# Patient Record
Sex: Female | Born: 1985
Health system: Southern US, Community
[De-identification: ages and names within clinical notes are randomized; demographics above are authoritative.]

---

## 2017-01-20 DIAGNOSIS — Z6820 Body mass index (BMI) 20.0-20.9, adult: Secondary | ICD-10-CM | POA: Diagnosis not present

## 2017-01-20 DIAGNOSIS — Z01419 Encounter for gynecological examination (general) (routine) without abnormal findings: Secondary | ICD-10-CM | POA: Diagnosis not present

## 2017-02-16 ENCOUNTER — Other Ambulatory Visit: Payer: Self-pay | Admitting: Sports Medicine

## 2017-02-16 DIAGNOSIS — M5412 Radiculopathy, cervical region: Secondary | ICD-10-CM | POA: Diagnosis not present

## 2017-02-16 DIAGNOSIS — M542 Cervicalgia: Secondary | ICD-10-CM

## 2017-02-16 DIAGNOSIS — N870 Mild cervical dysplasia: Secondary | ICD-10-CM | POA: Diagnosis not present

## 2017-02-16 DIAGNOSIS — R87612 Low grade squamous intraepithelial lesion on cytologic smear of cervix (LGSIL): Secondary | ICD-10-CM | POA: Diagnosis not present

## 2017-02-16 MED FILL — ALPRAZolam 0.25 MG TABS: 0.25 | 15 days supply | Qty: 30 | Fill #0

## 2017-02-23 ENCOUNTER — Other Ambulatory Visit: Payer: Self-pay

## 2017-02-28 ENCOUNTER — Ambulatory Visit (HOSPITAL_COMMUNITY)
Admission: RE | Admit: 2017-02-28 | Discharge: 2017-02-28 | Disposition: A | Discharge status: Home / Self Care | Payer: 59 | Source: Ambulatory Visit | Attending: Sports Medicine | Admitting: Sports Medicine

## 2017-02-28 DIAGNOSIS — M50222 Other cervical disc displacement at C5-C6 level: Secondary | ICD-10-CM | POA: Insufficient documentation

## 2017-02-28 DIAGNOSIS — M542 Cervicalgia: Secondary | ICD-10-CM | POA: Insufficient documentation

## 2017-02-28 DIAGNOSIS — M47812 Spondylosis without myelopathy or radiculopathy, cervical region: Secondary | ICD-10-CM | POA: Insufficient documentation

## 2017-03-14 DIAGNOSIS — F411 Generalized anxiety disorder: Secondary | ICD-10-CM | POA: Diagnosis not present

## 2017-05-12 DIAGNOSIS — F411 Generalized anxiety disorder: Secondary | ICD-10-CM | POA: Diagnosis not present

## 2017-07-11 DIAGNOSIS — F411 Generalized anxiety disorder: Secondary | ICD-10-CM | POA: Diagnosis not present

## 2017-07-11 DIAGNOSIS — N926 Irregular menstruation, unspecified: Secondary | ICD-10-CM | POA: Diagnosis not present

## 2017-07-11 DIAGNOSIS — N760 Acute vaginitis: Secondary | ICD-10-CM | POA: Diagnosis not present

## 2017-07-21 ENCOUNTER — Encounter: Payer: Self-pay | Admitting: Hematology

## 2017-07-21 ENCOUNTER — Telehealth: Payer: Self-pay | Admitting: Hematology

## 2017-07-21 NOTE — Telephone Encounter (Signed)
Pt has been scheduled to see Dr. Irene Limbo on 9/6 at 11am. Pt aware to arrive 30 minutes early. Address and insurance verified. Letter mailed.

## 2017-08-08 NOTE — Progress Notes (Signed)
HEMATOLOGY/ONCOLOGY CONSULTATION NOTE  Date of Service: 08/10/2017  Patient Care Team: Patient, No Pcp Per as PCP - General (General Practice)  CHIEF COMPLAINTS/PURPOSE OF CONSULTATION:  Low WBC  HISTORY OF PRESENTING ILLNESS:   Christina Hubbard is a wonderful 31 y.o. female who has been referred to [place] by Dr. Julien Girt her Gynecologist for evaluation and management of low WBCs and an palpable swollen lymph node in bilateral groin area.   Patient had routine labs with Dr. Julien Girt on 07/12/2017 which showed leukopenia with a WBC count of 3.2k with an [place] of 1150. Normal hemoglobin of 12.6 with a normal platelet count of 223k.  Patient notes she was diagnosed with bacterial vaginosis by her gynecologist, which she was treated for. She notes to having chronic back pain around her scapula area that will go up her neck and bring numbness to her fingers. This back pain started 10 years ago and recently has gotten worse. She has had a MRI of her c-spine and an Xray was done as well.  MRI of the C-spine showed Straightening of cervical lordosis with slight reversal at C4-5. No acute osseous abnormality or abnormal cord signal. Small C5-6 central disc protrusion with slight effacement of ventral thecal sac. No significant foraminal narrowing or canal stenosis   She notes a year and a half ago a rash that comes and goes in her groin area that responded to antifungal medication. Each time she sees lymph nodes and they change in appearence and can be seen in the mirror. No rash anywhere else. The rash was flat and reddish brown with edges and some satellite bumps. On clinical examination today the patient had minimal subcentimeter few inguinal nodules which one significantly increased. Minimal rash with some mild postinflammatory hyperpigmentation.  Socially she denies smoking and rarely drinks and she has not been pregnant before. Her mother was just diagnosed with ER/PR positive stage IIB breast cancer at 31  yo and did not meet criteria for genetic testing.    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 She denies fevers, chills, coughs, and colds. Lately she has headaches in her cervical spine area, Cervicalgia. She reports this stems from her job. She will take advil 2-3 times a week for pain along with ibuprofen. Has been using up to 1200 mg of ibuprofen daily. I discussed how this can effect her WBC. She has never been told her blood counts were low before.   She does not see a PCP but does see her gynecologist regularly. She denies chills, fever but has lost 10 pounds in a year and a half. She relates this to healthier living.   Her previous 07/12/17 lab results showed Hg and platelet counts are normal and her WBC and ANC were borderline low as noted above. I discussed the possibility of isolated neutropenia related to her infection or the use of NSAIDs. She denies recent vaccinations and tattoos. She denies taking any supplements.    She has had no previous labs demonstrating leukopenia or neutropenia and no issues with frequent or significant infections.  MEDICAL HISTORY:  1) Chronic neck pains  MRI c spine with Straightening of cervical lordosis with slight reversal at C4-5. No acute osseous abnormality or abnormal cord signal. Small C5-6 central disc protrusion with slight effacement of ventral thecal sac. No significant foraminal narrowing or canal stenosis  2) Candida Intertrigo  3) bacterial  vaginosis   SURGICAL HISTORY:  wisdom teeth extractions    SOCIAL HISTORY: Social History   Social History  . Marital status: Single    Spouse name: N/A  . Number of children: N/A  . Years of education: N/A   Occupational History  . Not on file.   Social History Main Topics  . Smoking status: Not on file  . Smokeless tobacco: Not on file  . Alcohol use Not on file  . Drug use: Unknown  . Sexual activity:  Not on file   Other Topics Concern  . Not on file   Social History Narrative  . 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   FAMILY HISTORY: Her mother was just diagnosed with ER/PR positive stage IIB breast cancer at 31 yo and did not meet criteria for genetic testing.  ALLERGIES:  has no allergies on file.  MEDICATIONS:  No current outpatient prescriptions on file.   No current facility-administered medications for this visit.     REVIEW OF SYSTEMS:    10 Point review of Systems was done is negative except as noted above.  PHYSICAL EXAMINATION: ECOG PERFORMANCE STATUS: 0 - Asymptomatic  . Vitals:   08/10/17 1119  BP: 118/64  Pulse: 99  Resp: 20  Temp: 98.2 F (36.8 C)  SpO2: 100%   Filed Weights   08/10/17 1119  Weight: 138 lb 11.2 oz (62.9 kg)   .Body mass index is 19.34 kg/m.  GENERAL:alert, in no acute distress and comfortable SKIN: no acute rashes, no significant lesions (+) rash minimal resudial hyperpigmentation from healed vacuolar rash in the medial groin bilaterally and some on mons pubis EYES: conjunctiva are pink and non-injected, sclera anicteric OROPHARYNX: MMM, no exudates, no oropharyngeal erythema or ulceration NECK: supple, no JVD LYMPH:  no palpable lymphadenopathy in the cervical, axillary or inguinal regions (+) few minimal sub centimeter inguinal lymph nodes bilaterally, no significant palpable lymphadenopathy LUNGS: clear to auscultation b/l with normal respiratory effort HEART: regular rate & rhythm ABDOMEN:  normoactive bowel sounds , non tender, not distended. Extremity: no pedal edema MSK: (+) Right Trapezius appears strained an tight and is likely the etiology of the patient's focal upper thoracic paraspinal back pain . PSYCH: alert & oriented x 3 with fluent speech NEURO: no focal  motor/sensory deficits  LABORATORY DATA:  I have reviewed the data as listed  . CBC Latest Ref Rng & Units 08/10/2017  WBC 3.9 - 10.3 10e3/uL 5.0  Hemoglobin 11.6 - 15.9 g/dL 13.3  Hematocrit 34.8 - 46.6 % 39.5  Platelets 145 - 400 10e3/uL 227   . CBC    Component Value Date/Time   WBC 5.0 08/10/2017 1220   RBC 4.26 08/10/2017 1220   HGB 13.3 08/10/2017 1220   HCT 39.5 08/10/2017 1220   PLT 227 08/10/2017 1220   MCV 92.7 08/10/2017 1220   MCH 31.2 08/10/2017 1220   MCHC 33.7 08/10/2017 1220   RDW 12.5 08/10/2017 1220   LYMPHSABS 1.5 08/10/2017 1220   MONOABS 0.3 08/10/2017 1220   EOSABS 0.1 08/10/2017 1220   BASOSABS 0.0 08/10/2017 1220   ANC 3100 . CMP Latest Ref Rng & Units 08/10/2017  Glucose 70 - 140 mg/dl 96  BUN 7.0 - 26.0 mg/dL 9.2  Creatinine 0.6 - 1.1 mg/dL 0.8  Sodium 136 - 145 mEq/L  138  Potassium 3.5 - 5.1 mEq/L 4.0  CO2 22 - 29 mEq/L 24  Calcium 8.4 - 10.4 mg/dL 9.8  Total Protein 6.4 - 8.3 g/dL 8.1  Total Bilirubin 0.20 - 1.20 mg/dL 0.86  Alkaline Phos 40 - 150 U/L 45  AST 5 - 34 U/L 18  ALT 0 - 55 U/L 8   . Lab Results  Component Value Date   LDH 129 08/10/2017     RADIOGRAPHIC STUDIES: I have personally reviewed the radiological images as listed and agreed with the findings in the report. No results found.  ASSESSMENT & PLAN:  Meilyn Heindl is a 31 y.o. female with  #1 Low White Blood Count leucopenia/Neutropenia -I discussed her previous 07/12/17 CBC which showed low WBC at 3.2 and ANC was 1152 with normal Hg and RBCs.  Repeat labs today show normalization of her leukopenia with a WBC count of 5k with an [place] of 3100.  Transient isolated neutropenia most likely related to use of NSAIDs or from her Bacterial vaginosis infection with the use of antibiotics. She does not report any recent overt viral infections. No tattoos. No overt body fluid exposure. No previous blood transfusions. No symptomatology suggestive of an autoimmune  condition. No issues with significant infections to suggest functional immunodeficiency. Plan -I discussed the possible etiologies of isolated neutropenia.  -I called her back with her initial lab results showing normalization of WBC counts and neutrophils  -she was quite relieved by this understandably .  given her occupation as a Marine scientist and risk of bodies of exposures we did send out the HIV and hepatitis C test which we shall follow up on  -LDH levels within normal limits with no overt evidence of significant palpable lymphadenopathy or hepatosplenomegaly to suggest a lymphoproliferative process. -B12 levels were still currently pending. -No additional hematologic workup recommended at this time -Patient will try to minimize use of NSAIDs this was the likely etiology of her neutropenia .  #2 Chronic upper right back pain  -today's Physical exam revealed tightness in the right trapezius muscle.  -She uses ibuprofen regularly for her back pain which can effect her WBCs. I suggest she switches to tylenol or topical NSAID or heating pads. She agreed. -I suggest she follow up with an orthopedist or physical therapist.   #3 Recurring rash in bilateral groin area -By physical exam, rash is reduced with remaining slight pigmentation. I recommend to keep area dry and watch what products she uses. Her inguinal lymph nodes are not significantly enlarged.   RTC with Dr. Irene Limbo as needed based on lasb   All of the patients questions were answered with apparent satisfaction. The patient knows to call the clinic with any problems, questions or concerns.  I spent 40 minutes counseling the patient face to face. The total time spent in the appointment was 60 minutes and more than 50% was on counseling and direct patient cares.  This document serves as a record of services personally performed by Sullivan Lone, MD. It was created on her behalf by Joslyn Devon, a trained medical scribe. The creation of this  record is based on the scribe's personal observations and the provider's statements to them. This document has been checked and approved by the attending provider.   Sullivan Lone MD [place] AAHIVMS [facility] Hematology/Oncology Physician [facility]  (Office):       [phone] (Work cell):  [phone] (Fax):           [phone]  08/10/2017  11:50 AM

## 2017-08-10 ENCOUNTER — Telehealth: Payer: Self-pay | Admitting: Hematology

## 2017-08-10 ENCOUNTER — Ambulatory Visit (HOSPITAL_BASED_OUTPATIENT_CLINIC_OR_DEPARTMENT_OTHER): Payer: 59

## 2017-08-10 ENCOUNTER — Ambulatory Visit (HOSPITAL_BASED_OUTPATIENT_CLINIC_OR_DEPARTMENT_OTHER): Payer: 59 | Admitting: Hematology

## 2017-08-10 VITALS — BP 118/64 | HR 99 | Temp 98.2°F | Resp 20 | Ht 71.0 in | Wt 138.7 lb

## 2017-08-10 DIAGNOSIS — D709 Neutropenia, unspecified: Secondary | ICD-10-CM

## 2017-08-10 DIAGNOSIS — G8929 Other chronic pain: Secondary | ICD-10-CM | POA: Diagnosis not present

## 2017-08-10 DIAGNOSIS — R21 Rash and other nonspecific skin eruption: Secondary | ICD-10-CM

## 2017-08-10 DIAGNOSIS — M5489 Other dorsalgia: Secondary | ICD-10-CM | POA: Diagnosis not present

## 2017-08-10 LAB — COMPREHENSIVE METABOLIC PANEL
ALBUMIN: 4.3 g/dL (ref 3.5–5.0)
ALK PHOS: 45 U/L (ref 40–150)
ALT: 8 U/L (ref 0–55)
AST: 18 U/L (ref 5–34)
Anion Gap: 9 mEq/L (ref 3–11)
BUN: 9.2 mg/dL (ref 7.0–26.0)
CO2: 24 mEq/L (ref 22–29)
Calcium: 9.8 mg/dL (ref 8.4–10.4)
Chloride: 105 mEq/L (ref 98–109)
Creatinine: 0.8 mg/dL (ref 0.6–1.1)
EGFR: >90 mL/min/{1.73_m2} (ref >90)
GLUCOSE: 96 mg/dL (ref 70–140)
POTASSIUM: 4 meq/L (ref 3.5–5.1)
SODIUM: 138 meq/L (ref 136–145)
TOTAL PROTEIN: 8.1 g/dL (ref 6.4–8.3)
Total Bilirubin: 0.86 mg/dL (ref 0.20–1.20)

## 2017-08-10 LAB — CBC & DIFF AND RETIC
BASO%: 0.4 % (ref 0.0–2.0)
Basophils Absolute: 0 10*3/uL (ref 0.0–0.1)
EOS ABS: 0.1 10*3/uL (ref 0.0–0.5)
EOS%: 1 % (ref 0.0–7.0)
HCT: 39.5 % (ref 34.8–46.6)
HEMOGLOBIN: 13.3 g/dL (ref 11.6–15.9)
IMMATURE RETIC FRACT: 1.4 % — AB (ref 1.60–10.00)
LYMPH%: 29.6 % (ref 14.0–49.7)
MCH: 31.2 pg (ref 25.1–34.0)
MCHC: 33.7 g/dL (ref 31.5–36.0)
MCV: 92.7 fL (ref 79.5–101.0)
MONO#: 0.3 10*3/uL (ref 0.1–0.9)
MONO%: 6.3 % (ref 0.0–14.0)
NEUT%: 62.7 % (ref 38.4–76.8)
NEUTROS ABS: 3.1 10*3/uL (ref 1.5–6.5)
Platelets: 227 10*3/uL (ref 145–400)
RBC: 4.26 10*6/uL (ref 3.70–5.45)
RDW: 12.5 % (ref 11.2–14.5)
RETIC %: 0.6 % — AB (ref 0.70–2.10)
Retic Ct Abs: 25.56 10*3/uL — ABNORMAL LOW (ref 33.70–90.70)
WBC: 5 10*3/uL (ref 3.9–10.3)
lymph#: 1.5 10*3/uL (ref 0.9–3.3)

## 2017-08-10 LAB — CHCC SMEAR

## 2017-08-10 LAB — LACTATE DEHYDROGENASE: LDH: 129 U/L (ref 125–245)

## 2017-08-10 NOTE — Telephone Encounter (Signed)
Scheduled lab add on per 9.6 los. No additional appts.

## 2017-08-10 NOTE — Patient Instructions (Signed)
Thank you for choosing [facility] to provide your oncology and hematology care.  To afford each patient quality time with our providers, please arrive 30 minutes before your scheduled appointment time.  If you arrive late for your appointment, you may be asked to reschedule.  We strive to give you quality time with our providers, and arriving late affects you and other patients whose appointments are after yours.   If you are a no show for multiple scheduled visits, you may be dismissed from the clinic at the providers discretion.    Again, thank you for choosing [facility], our hope is that these requests will decrease the amount of time that you wait before being seen by our physicians.  ______________________________________________________________________  Should you have questions after your visit to the [facility], please contact our office at [phone] between the hours of 8:30 and 4:30 p.m.    Voicemails left after 4:30p.m will not be returned until the following business day.    For prescription refill requests, please have your pharmacy contact us directly.  Please also try to allow 48 hours for prescription requests.    Please contact the scheduling department for questions regarding scheduling.  For scheduling of procedures such as PET scans, CT scans, MRI, Ultrasound, etc please contact central scheduling at [phone].    Resources For Cancer Patients and Caregivers:   Oncolink.org:  A wonderful resource for patients and healthcare providers for information regarding your disease, ways to tract your treatment, what to expect, etc.     American Cancer Society:  800-227-2345  Can help patients locate various types of support and financial assistance  Cancer Care: 1-800-813-HOPE (4673) Provides financial assistance, online support groups, medication/co-pay assistance.    [place] DSS:  336-641-3447 Where to apply for food  stamps, Medicaid, and utility assistance  Medicare Rights Center: 800-333-4114 Helps people with Medicare understand their rights and benefits, navigate the Medicare system, and secure the quality healthcare they deserve  SCAT: 336-333-6589 [place] [employer]'s shared-ride transportation service for eligible riders who have a disability that prevents them from riding the fixed route bus.    For additional information on assistance programs please contact our social worker:   Grier Hock/Abigail Elmore:  [phone]            

## 2017-08-11 LAB — SEDIMENTATION RATE: SED RATE: 2 mm/h (ref 0–32)

## 2017-08-11 LAB — HIV ANTIBODY (ROUTINE TESTING W REFLEX): HIV Screen 4th Generation wRfx: NONREACTIVE

## 2017-08-11 LAB — HEPATITIS C ANTIBODY: Hep C Virus Ab: <0.1 s/co ratio (ref 0.0–0.9)

## 2017-08-11 LAB — VITAMIN B12: VITAMIN B 12: 317 pg/mL (ref 232–1245)

## 2018-01-25 DIAGNOSIS — D225 Melanocytic nevi of trunk: Secondary | ICD-10-CM | POA: Diagnosis not present

## 2018-01-25 DIAGNOSIS — L811 Chloasma: Secondary | ICD-10-CM | POA: Diagnosis not present

## 2018-01-25 DIAGNOSIS — H5213 Myopia, bilateral: Secondary | ICD-10-CM | POA: Diagnosis not present

## 2018-01-25 DIAGNOSIS — B360 Pityriasis versicolor: Secondary | ICD-10-CM | POA: Diagnosis not present

## 2018-01-25 DIAGNOSIS — L821 Other seborrheic keratosis: Secondary | ICD-10-CM | POA: Diagnosis not present

## 2018-02-06 DIAGNOSIS — Z681 Body mass index (BMI) 19 or less, adult: Secondary | ICD-10-CM | POA: Diagnosis not present

## 2018-02-06 DIAGNOSIS — Z01419 Encounter for gynecological examination (general) (routine) without abnormal findings: Secondary | ICD-10-CM | POA: Diagnosis not present

## 2018-03-13 DIAGNOSIS — F411 Generalized anxiety disorder: Secondary | ICD-10-CM | POA: Diagnosis not present

## 2018-03-21 DIAGNOSIS — F411 Generalized anxiety disorder: Secondary | ICD-10-CM | POA: Diagnosis not present

## 2018-04-16 DIAGNOSIS — N871 Moderate cervical dysplasia: Secondary | ICD-10-CM | POA: Diagnosis not present

## 2018-04-16 DIAGNOSIS — N870 Mild cervical dysplasia: Secondary | ICD-10-CM | POA: Diagnosis not present

## 2018-04-16 DIAGNOSIS — R87810 Cervical high risk human papillomavirus (HPV) DNA test positive: Secondary | ICD-10-CM | POA: Diagnosis not present

## 2018-04-16 DIAGNOSIS — R87610 Atypical squamous cells of undetermined significance on cytologic smear of cervix (ASC-US): Secondary | ICD-10-CM | POA: Diagnosis not present

## 2018-07-03 IMAGING — MR MR CERVICAL SPINE W/O CM
4 of 5 series · 19 of 48 positions shown · IV contrast (Yes)
Comparison: None.

CLINICAL DATA: 30 y/o  F; neck pain radiating to the right arm.

EXAM:
MRI CERVICAL SPINE WITHOUT CONTRAST
TECHNIQUE: Multiplanar, multisequence MR imaging of the cervical spine was
performed. No intravenous contrast was administered.

[Series 2: T1 · sagittal · 3.0mm · 0.41mm/px · 3 of 12 slices shown]
[im 1/12]
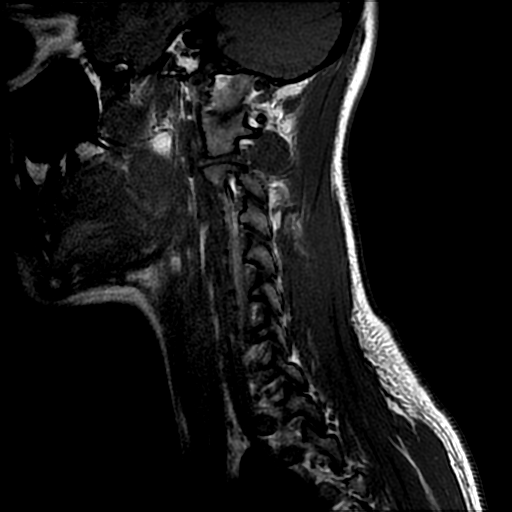
[im 6/12]
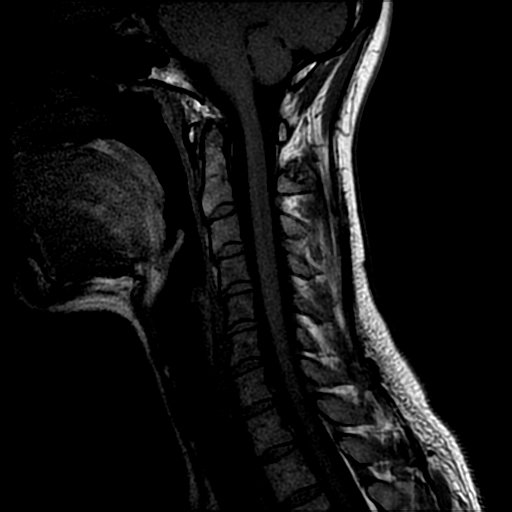
[im 12/12]
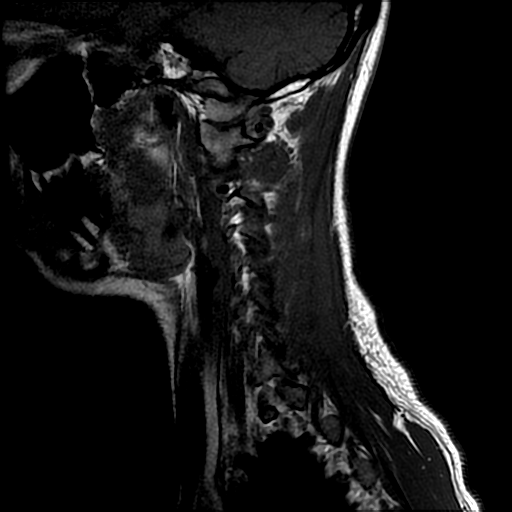

[Series 3: sag ir · sagittal · 3.0mm · 0.41mm/px · 3 of 12 slices shown]
[im 1/12]
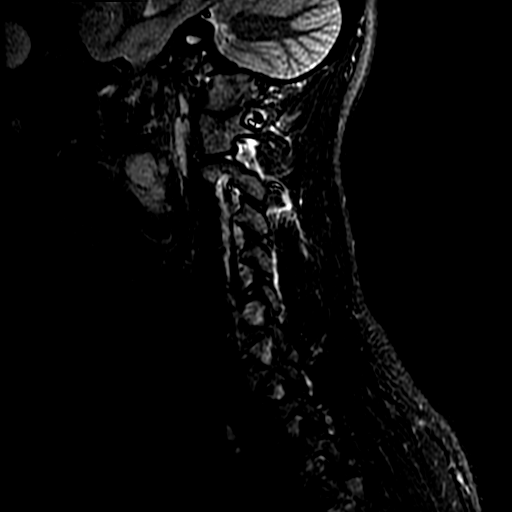
[im 6/12]
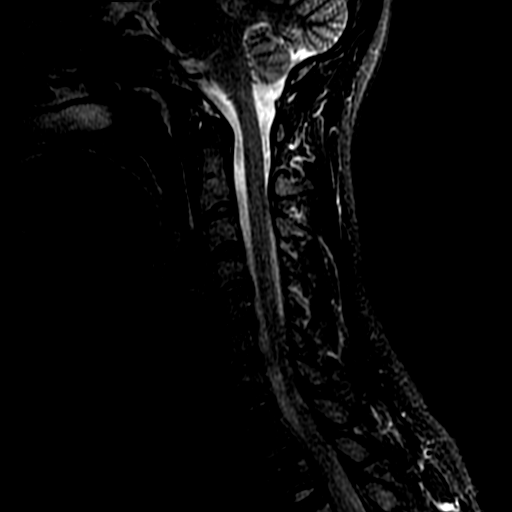
[im 12/12]
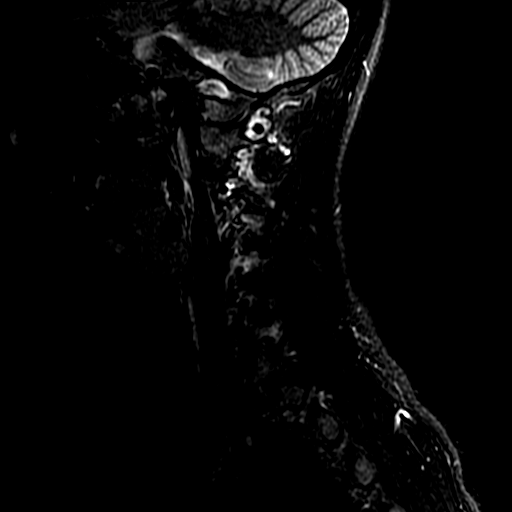

[Series 4: T2 post-contrast · sagittal · 3.0mm · 0.41mm/px · 6 of 12 slices shown]
[im 1/12]
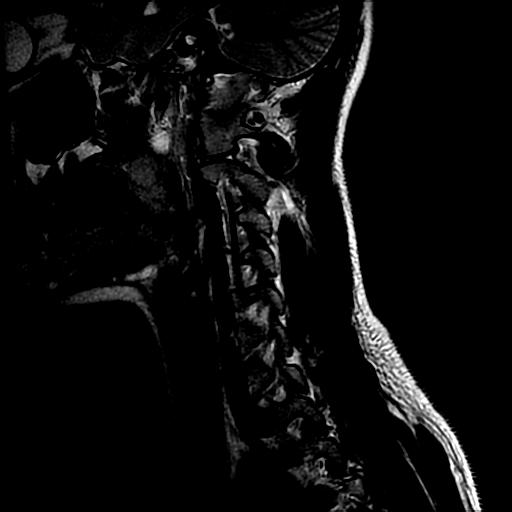
[im 3/12]
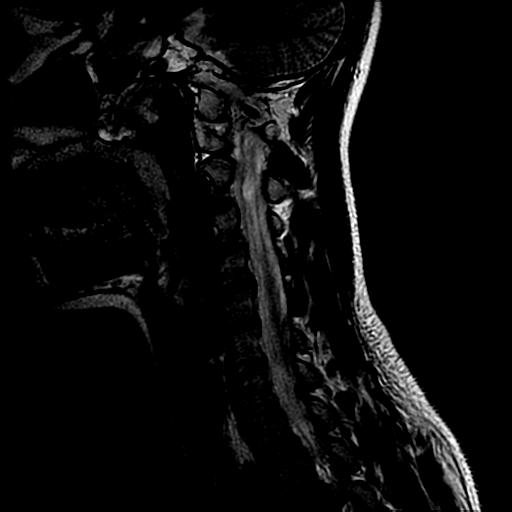
[im 5/12]
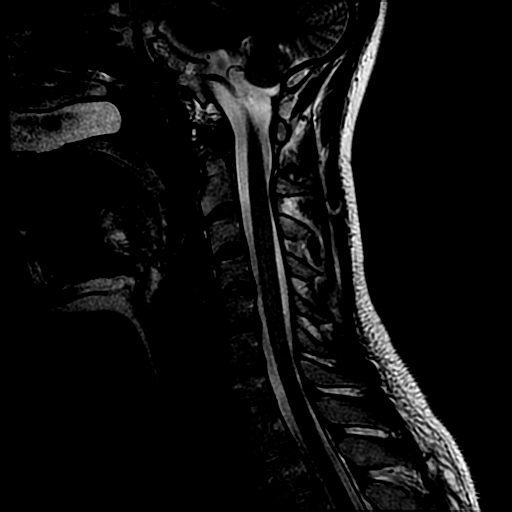
[im 7/12]
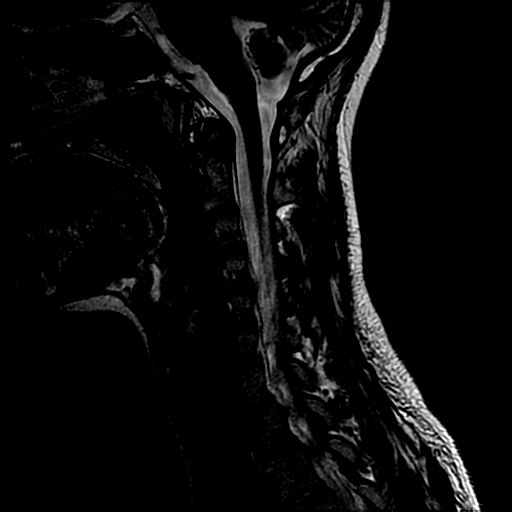
[im 9/12]
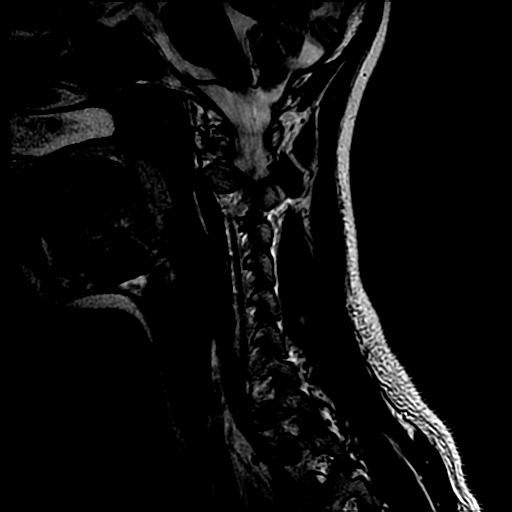
[im 12/12]
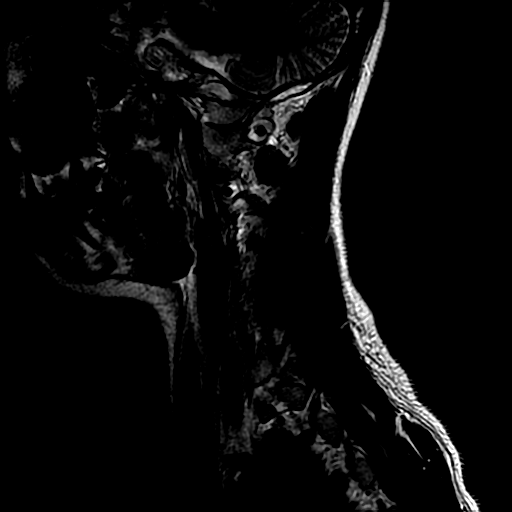

[Series 6: T2 · axial · 3.1mm · 0.35mm/px · z∈[-57,+29]mm · 7 of 33 slices shown]
[im 3/33]
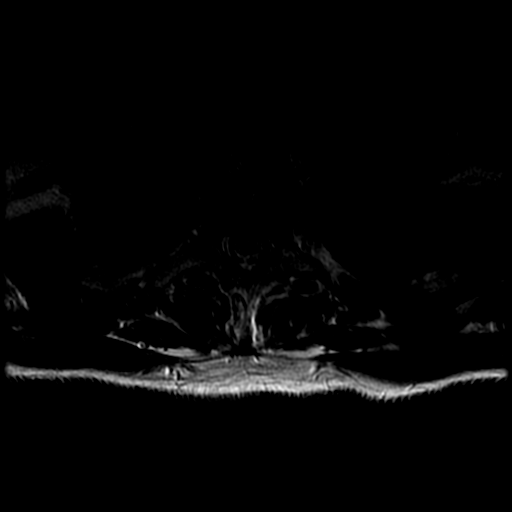
[im 5/33]
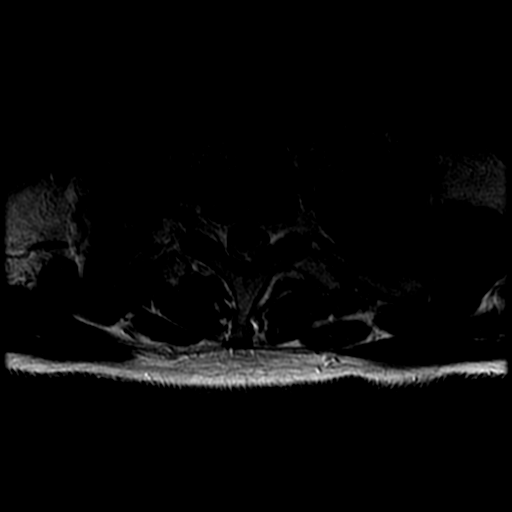
[im 7/33]
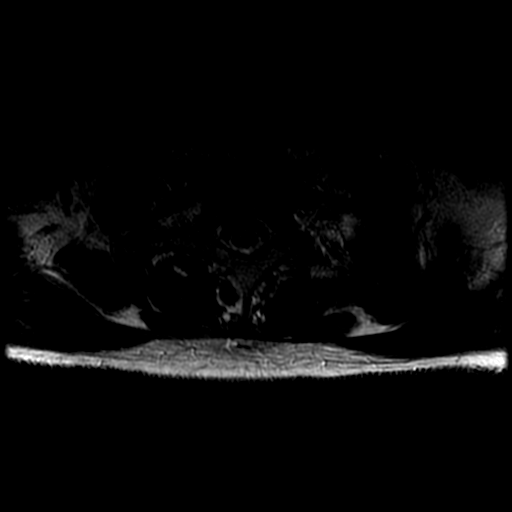
[im 11/33]
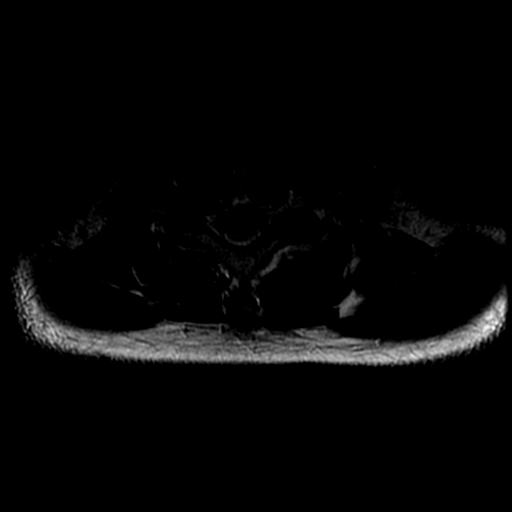
[im 15/33]
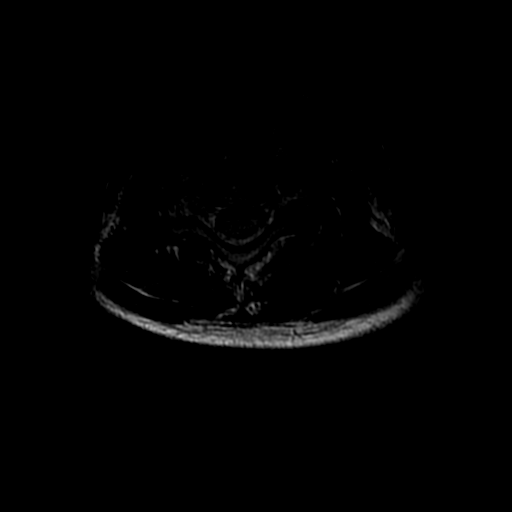
[im 18/33]
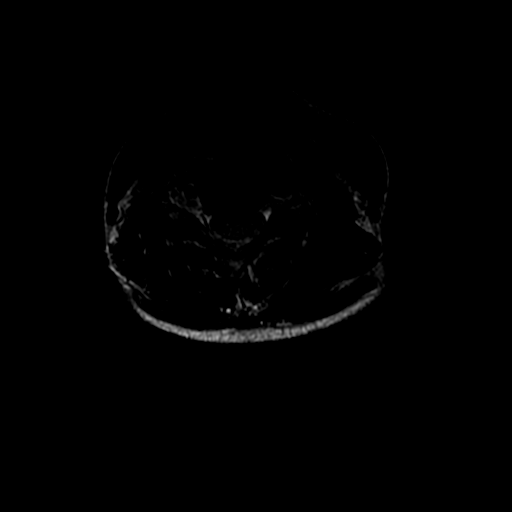
[im 28/33]
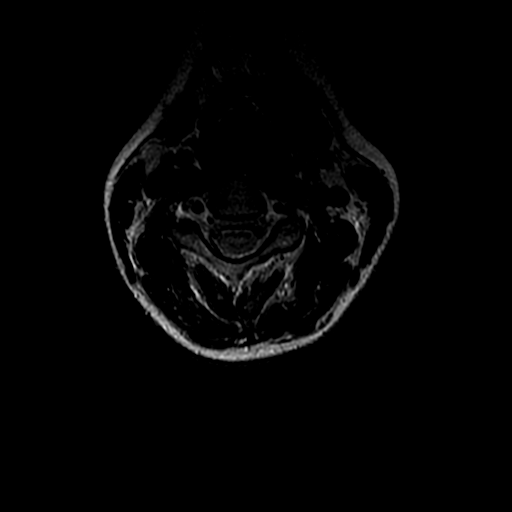

[19 of 48 positions shown; findings below may reference images not displayed]

FINDINGS: Alignment: Straightening of cervical lordosis with slight reversal
at C4-5. No listhesis.

Vertebrae: No fracture, evidence of discitis, or bone lesion.

Cord: Normal signal and morphology.

Posterior Fossa, vertebral arteries, paraspinal tissues: Negative.

Disc levels:

C2-3: No significant disc displacement, foraminal narrowing, or
canal stenosis.

C3-4: No significant disc displacement, foraminal narrowing, or
canal stenosis.

C4-5: No significant disc displacement, foraminal narrowing, or
canal stenosis.

C5-6: Small central disc protrusion with slight effacement of
ventral thecal sac. No significant canal stenosis or foraminal
narrowing.

C6-7: No significant disc displacement, foraminal narrowing, or
canal stenosis.

C7-T1: No significant disc displacement, foraminal narrowing, or
canal stenosis.
IMPRESSION: 1. Straightening of cervical lordosis with slight reversal at C4-5.
2. No acute osseous abnormality or abnormal cord signal.
3. Small C5-6 central disc protrusion with slight effacement of
ventral thecal sac.
4. No significant foraminal narrowing or canal stenosis.

By: Vohod Poline M.D.
